# Patient Record
Sex: Male | Born: 1960 | Race: White | Hispanic: No | Marital: Married | Smoking: Never smoker
Health system: Southern US, Community
[De-identification: ages and names within clinical notes are randomized; demographics above are authoritative.]

## PROBLEM LIST (undated history)

## (undated) DIAGNOSIS — K219 Gastro-esophageal reflux disease without esophagitis: Secondary | ICD-10-CM

## (undated) DIAGNOSIS — Z86010 Personal history of colonic polyps: Secondary | ICD-10-CM

## (undated) HISTORY — DX: Gastro-esophageal reflux disease without esophagitis: K21.9

## (undated) HISTORY — PX: EYE SURGERY: SHX253

## (undated) HISTORY — DX: Personal history of colonic polyps: Z86.010

## (undated) HISTORY — PX: VASECTOMY: SHX75

---

## 2004-01-15 ENCOUNTER — Encounter: Admission: RE | Admit: 2004-01-15 | Discharge: 2004-01-15 | Payer: Self-pay | Admitting: Family Medicine

## 2009-03-14 ENCOUNTER — Ambulatory Visit: Payer: Self-pay | Admitting: Internal Medicine

## 2009-03-14 LAB — CONVERTED CEMR LAB
ALT: 19 units/L (ref 0–53)
Basophils Relative: 0.4 % (ref 0.0–3.0)
Bilirubin Urine: NEGATIVE
Bilirubin, Direct: 0.1 mg/dL (ref 0.0–0.3)
Calcium: 9.3 mg/dL (ref 8.4–10.5)
Cholesterol: 174 mg/dL (ref 0–200)
Eosinophils Absolute: 0.1 10*3/uL (ref 0.0–0.7)
Eosinophils Relative: 2.8 % (ref 0.0–5.0)
GFR calc non Af Amer: 84.06 mL/min (ref >60)
HCT: 40.1 % (ref 39.0–52.0)
HDL: 94 mg/dL (ref >39.00)
Hemoglobin, Urine: NEGATIVE
Leukocytes, UA: NEGATIVE
Lymphs Abs: 1.7 10*3/uL (ref 0.7–4.0)
MCHC: 34.3 g/dL (ref 30.0–36.0)
MCV: 93.3 fL (ref 78.0–100.0)
Monocytes Absolute: 0.5 10*3/uL (ref 0.1–1.0)
Neutrophils Relative %: 52 % (ref 43.0–77.0)
Nitrite: NEGATIVE
PSA: 0.66 ng/mL (ref 0.10–4.00)
Platelets: 188 10*3/uL (ref 150.0–400.0)
Potassium: 4.5 meq/L (ref 3.5–5.1)
Sodium: 143 meq/L (ref 135–145)
TSH: 1.21 microintl units/mL (ref 0.35–5.50)
Total Bilirubin: 0.9 mg/dL (ref 0.3–1.2)
Total Protein: 7 g/dL (ref 6.0–8.3)
VLDL: 7.6 mg/dL (ref 0.0–40.0)
WBC: 4.8 10*3/uL (ref 4.5–10.5)

## 2009-03-17 ENCOUNTER — Ambulatory Visit: Payer: Self-pay | Admitting: Internal Medicine

## 2009-03-17 DIAGNOSIS — K219 Gastro-esophageal reflux disease without esophagitis: Secondary | ICD-10-CM

## 2009-03-17 HISTORY — DX: Gastro-esophageal reflux disease without esophagitis: K21.9

## 2009-04-08 ENCOUNTER — Ambulatory Visit: Payer: Self-pay | Admitting: Gastroenterology

## 2009-09-22 ENCOUNTER — Ambulatory Visit: Payer: Self-pay | Admitting: Internal Medicine

## 2009-09-22 DIAGNOSIS — M25539 Pain in unspecified wrist: Secondary | ICD-10-CM | POA: Insufficient documentation

## 2009-09-25 ENCOUNTER — Encounter (INDEPENDENT_AMBULATORY_CARE_PROVIDER_SITE_OTHER): Payer: Self-pay | Admitting: *Deleted

## 2009-10-31 ENCOUNTER — Encounter: Payer: Self-pay | Admitting: Internal Medicine

## 2009-11-10 ENCOUNTER — Telehealth: Payer: Self-pay | Admitting: Internal Medicine

## 2009-11-17 ENCOUNTER — Encounter: Payer: Self-pay | Admitting: Internal Medicine

## 2010-11-05 NOTE — Progress Notes (Signed)
Summary: EKG  Phone Note Outgoing Call   Summary of Call: please inform pt , that we are in receipt of his EKG performed at the recent GI evaluation;  it is normal rhythm but has "first degree block";  this is a minor electrical conduction abnormality of the heart that is not normal, but is not serious, does not get worse , is not related to other more serious problems, and is permanent but does not require further eval or treatment Initial call taken by: Corwin Levins MD,  November 10, 2009 9:57 AM  Follow-up for Phone Call        Patient notified and had been notified previously when he was a patient at [facility]. Follow-up by: Lucious Groves,  November 10, 2009 1:43 PM

## 2010-11-05 NOTE — Consult Note (Signed)
Summary: [facility]  [facility]   Imported By: Lester Daniels 11/22/2009 08:48:55  _____________________________________________________________________  External Attachment:    Type:   Image     Comment:   External Document

## 2011-11-08 ENCOUNTER — Ambulatory Visit: Payer: Self-pay | Admitting: Internal Medicine

## 2011-11-08 ENCOUNTER — Ambulatory Visit (INDEPENDENT_AMBULATORY_CARE_PROVIDER_SITE_OTHER): Payer: BC Managed Care – PPO | Admitting: Internal Medicine

## 2011-11-08 ENCOUNTER — Encounter: Payer: Self-pay | Admitting: Internal Medicine

## 2011-11-08 VITALS — BP 120/80 | HR 56 | Temp 99.6°F | Ht 70.0 in | Wt 185.0 lb

## 2011-11-08 DIAGNOSIS — J209 Acute bronchitis, unspecified: Secondary | ICD-10-CM

## 2011-11-08 DIAGNOSIS — Z0000 Encounter for general adult medical examination without abnormal findings: Secondary | ICD-10-CM

## 2011-11-08 DIAGNOSIS — Z86010 Personal history of colonic polyps: Secondary | ICD-10-CM

## 2011-11-08 HISTORY — DX: Personal history of colonic polyps: Z86.010

## 2011-11-08 MED ORDER — HYDROCODONE-HOMATROPINE 5-1.5 MG/5ML PO SYRP: 5.0000 mL | ORAL_SOLUTION | Freq: Four times a day (QID) | ORAL | Status: AC | PRN

## 2011-11-08 MED ORDER — LEVOFLOXACIN 250 MG PO TABS: 250.0000 mg | ORAL_TABLET | Freq: Every day | ORAL | Status: AC
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
## 2011-11-08 NOTE — Progress Notes (Signed)
  Subjective:    Patient ID: Joseph Trujillo, male    DOB: 1961-01-24, 51 y.o.   MRN: [id number]  HPI    Review of Systems     Objective:   Physical Exam        Assessment & Plan:

## 2011-11-08 NOTE — Assessment & Plan Note (Signed)
Mild to mod, for antibx course,  to f/u any worsening symptoms or concerns 

## 2011-11-08 NOTE — Progress Notes (Signed)
  Subjective:    Patient ID: Joseph Trujillo, male    DOB: August 05, 1961, 51 y.o.   MRN: [id number]  HPI Here with acute onset mild to mod 2 wks ST, HA, general weakness and malaise, with prod cough greenish sputum, but Pt denies chest pain, increased sob or doe, wheezing, orthopnea, PND, increased LE swelling, palpitations, dizziness or syncope.   Pt denies polydipsia, polyuria. Past Medical History  Diagnosis Date  . Hx of colonic polyp 11/08/2011  . GERD 03/17/2009   Past Surgical History  Procedure Date  . Vasectomy     reports that he has never smoked. He does not have any smokeless tobacco history on file. He reports that he drinks alcohol. He reports that he does not use illicit drugs. family history includes Cancer in his mother and Heart disease in his father. No Known Allergies No current outpatient prescriptions on file prior to visit.   Review of Systems Review of Systems  Constitutional: Negative for diaphoresis and unexpected weight change.  HENT: Negative for drooling and tinnitus.   Eyes: Negative for photophobia and visual disturbance.  Respiratory: Negative for choking and stridor.   Gastrointestinal: Negative for vomiting and blood in stool.  Genitourinary: Negative for hematuria and decreased urine volume.      Objective:   Physical Exam BP 120/80  Pulse 56  Temp(Src) 99.6 F (37.6 C) (Oral)  Ht 5\' 10"  (1.778 m)  Wt 185 lb (83.915 kg)  BMI 26.54 kg/m2  SpO2 97% Physical Exam  VS noted, mild ill Constitutional: Pt appears well-developed and well-nourished.  HENT: Head: Normocephalic.  Right Ear: External ear normal.  Left Ear: External ear normal.  Bilat tm's mild erythema.  Sinus nontender.  Pharynx mild erythema Eyes: Conjunctivae and EOM are normal. Pupils are equal, round, and reactive to light.  Neck: Normal range of motion. Neck supple.  Cardiovascular: Normal rate and regular rhythm.   Pulmonary/Chest: Effort normal and breath sounds normal.    Neurological: Pt is alert. No cranial nerve deficit.  Skin: Skin is warm. No erythema.  Psychiatric: Pt behavior is normal. Thought content normal.     Assessment & Plan:

## 2011-11-08 NOTE — Patient Instructions (Addendum)
Take all new medications as prescribed. Continue all other medications as before Please return in 6 mo with Lab testing done 3-5 days before  

## 2012-05-12 ENCOUNTER — Ambulatory Visit: Payer: BC Managed Care – PPO | Admitting: Internal Medicine

## 2013-07-19 ENCOUNTER — Ambulatory Visit (INDEPENDENT_AMBULATORY_CARE_PROVIDER_SITE_OTHER): Payer: BC Managed Care – PPO | Admitting: Internal Medicine

## 2013-07-19 ENCOUNTER — Encounter: Payer: Self-pay | Admitting: Internal Medicine

## 2013-07-19 VITALS — BP 128/90 | HR 65 | Temp 98.1°F | Wt 189.0 lb

## 2013-07-19 DIAGNOSIS — T300 Burn of unspecified body region, unspecified degree: Secondary | ICD-10-CM | POA: Insufficient documentation

## 2013-07-19 MED ORDER — SILVER SULFADIAZINE 1 % EX CREA: TOPICAL_CREAM | Freq: Two times a day (BID) | CUTANEOUS | Status: DC
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
## 2013-07-31 ENCOUNTER — Encounter: Payer: Self-pay | Admitting: Internal Medicine

## 2013-08-02 ENCOUNTER — Ambulatory Visit (INDEPENDENT_AMBULATORY_CARE_PROVIDER_SITE_OTHER): Payer: BC Managed Care – PPO | Admitting: Internal Medicine

## 2013-08-02 ENCOUNTER — Encounter: Payer: Self-pay | Admitting: Internal Medicine

## 2013-08-02 VITALS — BP 140/96 | HR 58 | Temp 97.8°F | Wt 187.4 lb

## 2013-08-02 DIAGNOSIS — T300 Burn of unspecified body region, unspecified degree: Secondary | ICD-10-CM
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
## 2013-12-27 ENCOUNTER — Ambulatory Visit: Payer: BC Managed Care – PPO | Admitting: Physician Assistant

## 2013-12-28 ENCOUNTER — Ambulatory Visit: Payer: BC Managed Care – PPO | Admitting: Internal Medicine

## 2014-07-10 ENCOUNTER — Ambulatory Visit (INDEPENDENT_AMBULATORY_CARE_PROVIDER_SITE_OTHER)
Admission: RE | Admit: 2014-07-10 | Discharge: 2014-07-10 | Disposition: A | Discharge status: Home / Self Care | Payer: BC Managed Care – PPO | Source: Ambulatory Visit | Attending: Family Medicine | Admitting: Family Medicine

## 2014-07-10 ENCOUNTER — Other Ambulatory Visit (INDEPENDENT_AMBULATORY_CARE_PROVIDER_SITE_OTHER): Payer: BC Managed Care – PPO

## 2014-07-10 ENCOUNTER — Encounter: Payer: Self-pay | Admitting: Family Medicine

## 2014-07-10 ENCOUNTER — Ambulatory Visit (INDEPENDENT_AMBULATORY_CARE_PROVIDER_SITE_OTHER): Payer: BC Managed Care – PPO | Admitting: Family Medicine

## 2014-07-10 VITALS — BP 126/78 | HR 61 | Ht 71.0 in | Wt 183.0 lb

## 2014-07-10 DIAGNOSIS — M79605 Pain in left leg: Secondary | ICD-10-CM

## 2014-07-10 DIAGNOSIS — M6289 Other specified disorders of muscle: Secondary | ICD-10-CM | POA: Insufficient documentation

## 2014-07-10 DIAGNOSIS — M6580 Other synovitis and tenosynovitis, unspecified site: Secondary | ICD-10-CM

## 2014-07-10 MED ORDER — NITROGLYCERIN 0.2 MG/HR TD PT24: MEDICATED_PATCH | TRANSDERMAL | Status: DC

## 2014-07-10 MED ORDER — MELOXICAM 15 MG PO TABS: 15.0000 mg | ORAL_TABLET | Freq: Every day | ORAL | Status: DC

## 2014-07-10 NOTE — Progress Notes (Signed)
Tawana ScaleZach Jhordan Trujillo D.O. [facility] [address] [place], [place] [address] Phone: [phone] Subjective:     CC: Pain in hip left  BJY:NWGNFAOZHYHPI:Subjective Joseph Trujillo is a 53 y.o. male coming in with complaint of left hip pain. Patient is an avid runner and is training for a marathon. Patient tended to notice that he is having some mild discomfort with running on the lateral aspect of his hip. Patient states that it seemed to be more in the lateral and anterior aspect of his hip. Patient states that unfortunately after a 16 mile jog he unfortunately had to discontinue because of the discomfort 3 days ago and is here for further evaluation. Patient states that the pain is localized more of a dull aching sensation. Can have some discomfort when he is putting pressure going up stairs. Denies any significant joint pain. Denies any significant groin pain. No radiation of the leg or any numbness or weakness. Patient rates the severity is 6/10. Patient has a marathon to run in 3 weeks to months make sure that he can successfully do this.     Past medical history, social, surgical and family history all reviewed in electronic medical record.   Review of Systems: No headache, visual changes, nausea, vomiting, diarrhea, constipation, dizziness, abdominal pain, skin rash, fevers, chills, night sweats, weight loss, swollen lymph nodes, body aches, joint swelling, muscle aches, chest pain, shortness of breath, mood changes.   Objective Blood pressure 126/78, pulse 61, height 5\' 11"  (1.803 m), weight 183 lb (83.008 kg), SpO2 99.00%.  General: No apparent distress alert and oriented x3 mood and affect normal, dressed appropriately.  HEENT: Pupils equal, extraocular movements intact  Respiratory: Patient's speak in full sentences and does not appear short of breath  Cardiovascular: No lower extremity edema, non tender, no erythema  Skin: Warm dry intact with no signs of infection or rash on extremities or  on axial skeleton.  Abdomen: Soft nontender  Neuro: Cranial nerves II through XII are intact, neurovascularly intact in all extremities with 2+ DTRs and 2+ pulses.  Lymph: No lymphadenopathy of posterior or anterior cervical chain or axillae bilaterally.  Gait normal with good balance and coordination.  MSK:  Non tender with full range of motion and good stability and symmetric strength and tone of shoulders, elbows, wrist,  knee and ankles bilaterally.  Hip: Left ROM IR: 25 Deg, ER: 45 Deg, Flexion: 120 Deg, Extension: 100 Deg, Abduction: 45 Deg, Adduction: 45 Deg Strength IR: 5/5, ER: 5/5, Flexion: 5/5, Extension: 5/5, Abduction: 5/5, Adduction: 5/5 Pelvic alignment unremarkable to inspection and palpation. Standing hip rotation and gait without trendelenburg sign / unsteadiness. Greater trochanter with mild tenderness but does have tenderness over the tensor fascia lata as well as the gluteus medius insertion.. No tenderness over piriformis and greater trochanter. pain with FABER BEEN NEGATIVE FADIR. No SI joint tenderness and normal minimal SI movement. Negative fulcrum test Contralateral hip unremarkable  MSK US performed of: Left This study was ordered, performed, and interpreted by Joseph Trujillo D.O.  Hip: Trochanteric bursa with mild swelling. Patient does have what appears to be a tear in the tensor fascia lata with increasing Doppler flow. Acetabular labrum visualized and without tears, displacement, or effusion in joint. Femoral neck appears unremarkable without increased power doppler signal along Cortex. No signs of stress fracture  IMPRESSION:  Mild greater trochanteric bursitis with a tensor fascia lata tear.    Impression and Recommendations:     This case required  medical decision making of moderate complexity.
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
## 2014-07-10 NOTE — Patient Instructions (Addendum)
Good to see you Joseph Trujillo 20 minutes 2 times daily. Usually after activity and before bed. Exercises 3 times a week.   Alternate the TFL and the glute exercises Running only 3 days a week. Potentially running on track or treadmill instead.  Vitain D 2000 IU daily Meloxicam daily for 10 days then as needed Nitroglycerin Protocol   Apply 1/4 nitroglycerin patch to affected area daily.  Change position of patch within the affected area every 24 hours.  You may experience a headache during the first 1-2 weeks of using the patch, these should subside.  If you experience headaches after beginning nitroglycerin patch treatment, you may take your preferred over the counter pain reliever.  Another side effect of the nitroglycerin patch is skin irritation or rash related to patch adhesive.  Please notify our office if you develop more severe headaches or rash, and stop the patch.  Tendon healing with nitroglycerin patch may require 12 to 24 weeks depending on the extent of injury.  Men should not use if taking Viagra, Cialis, or Levitra.   Do not use if you have migraines or rosacea.   Come back in 2 weeks. If still in pain will try injection.

## 2014-07-19 ENCOUNTER — Ambulatory Visit: Payer: BC Managed Care – PPO | Admitting: Family Medicine

## 2014-07-24 ENCOUNTER — Ambulatory Visit: Payer: BC Managed Care – PPO | Admitting: Internal Medicine

## 2016-07-13 ENCOUNTER — Telehealth: Payer: Self-pay | Admitting: Internal Medicine

## 2016-07-13 NOTE — Telephone Encounter (Signed)
Ok with me, but remember I do not treat chronic pain with sched II narcotics or higher in my practice

## 2016-07-13 NOTE — Telephone Encounter (Signed)
Patient was last seen in 2013. He is now requesting a CPE with you. Do you still accept him as a patient? Please advise, Thank you.

## 2016-07-13 NOTE — Telephone Encounter (Signed)
I called patient and LVM to call back. No answer.

## 2017-02-24 ENCOUNTER — Encounter: Payer: Self-pay | Admitting: Sports Medicine

## 2017-02-24 ENCOUNTER — Ambulatory Visit (INDEPENDENT_AMBULATORY_CARE_PROVIDER_SITE_OTHER): Payer: BC Managed Care – PPO | Admitting: Sports Medicine

## 2017-02-24 VITALS — BP 120/82 | HR 75 | Ht 71.0 in | Wt 190.8 lb

## 2017-02-24 DIAGNOSIS — B070 Plantar wart: Secondary | ICD-10-CM

## 2017-02-24 DIAGNOSIS — M79672 Pain in left foot: Secondary | ICD-10-CM | POA: Diagnosis not present

## 2017-02-24 NOTE — Patient Instructions (Signed)
Try getting over-the-counter Compound W Band-Aids and applying daily. Also use a pumice stone to gently debride calluses on the bottom of your feet. I recommend that you obtain over-the-counter SOLE  medium cushioned insoles.  These can be found at [employer] - or on-line at [employer]  Search for "SOLE Active Medium Shoe Insoles"  If you have not had good improvement over the next several weeks we can discuss freezing the wart at that time.
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
## 2017-02-28 DIAGNOSIS — B070 Plantar wart: Secondary | ICD-10-CM | POA: Insufficient documentation

## 2017-02-28 NOTE — Assessment & Plan Note (Signed)
Treatment per AVS. We did discuss the possibility of cryoablation if any lack of improvement would like to consider conservative management initially.  Can consider custom cushion orthotics going forward as well given the ongoing pain he has had.

## 2017-03-21 ENCOUNTER — Encounter: Payer: Self-pay | Admitting: Physician Assistant

## 2017-03-21 ENCOUNTER — Ambulatory Visit (INDEPENDENT_AMBULATORY_CARE_PROVIDER_SITE_OTHER): Payer: BC Managed Care – PPO | Admitting: Physician Assistant

## 2017-03-21 VITALS — BP 108/68 | HR 61 | Temp 97.9°F | Resp 18 | Ht 69.96 in | Wt 188.8 lb

## 2017-03-21 DIAGNOSIS — W57XXXA Bitten or stung by nonvenomous insect and other nonvenomous arthropods, initial encounter: Secondary | ICD-10-CM

## 2017-03-21 DIAGNOSIS — L03113 Cellulitis of right upper limb: Secondary | ICD-10-CM | POA: Diagnosis not present

## 2017-03-21 MED ORDER — DOXYCYCLINE HYCLATE 100 MG PO CAPS: 100.0000 mg | ORAL_CAPSULE | Freq: Two times a day (BID) | ORAL | 0 refills | Status: DC
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
## 2017-03-21 NOTE — Patient Instructions (Addendum)
We are going to treat your cellulitis with doxycycline, which is an antibiotic that also treats  tick borne illnesses.Please use sunscreen while on this antibiotic as it increases your chances of getting sun burned. It is likely you may have some retained products where the tick bit you but your body will expel this over time. The redness should improve over the next 24-48 hours. Please return to clinic if symptoms worsen, do not improve, or as needed    Cellulitis, Adult Cellulitis is a skin infection. The infected area is usually red and sore. This condition occurs most often in the arms and lower legs. It is very important to get treated for this condition. Follow these instructions at home:  Take over-the-counter and prescription medicines only as told by your doctor.  If you were prescribed an antibiotic medicine, take it as told by your doctor. Do not stop taking the antibiotic even if you start to feel better.  Drink enough fluid to keep your pee (urine) clear or pale yellow.  Do not touch or rub the infected area.  Raise (elevate) the infected area above the level of your heart while you are sitting or lying down.  Place warm or cold wet cloths (warm or cold compresses) on the infected area. Do this as told by your doctor.  Keep all follow-up visits as told by your doctor. This is important. These visits let your doctor make sure your infection is not getting worse. Contact a doctor if:  You have a fever.  Your symptoms do not get better after 1-2 days of treatment.  Your bone or joint under the infected area starts to hurt after the skin has healed.  Your infection comes back. This can happen in the same area or another area.  You have a swollen bump in the infected area.  You have new symptoms.  You feel ill and also have muscle aches and pains. Get help right away if:  Your symptoms get worse.  You feel very sleepy.  You throw up (vomit) or have watery poop  (diarrhea) for a long time.  There are red streaks coming from the infected area.  Your red area gets larger.  Your red area turns darker. This information is not intended to replace advice given to you by your health care provider. Make sure you discuss any questions you have with your health care provider. Document Released: 03/08/2008 Document Revised: 02/26/2016 Document Reviewed: 07/30/2015 Elsevier Interactive Patient Education  2018 [employer].  Tick Bite Information Introduction Ticks are insects that attach themselves to the skin. There are many types of ticks. Common types include wood ticks and deer ticks. Sometimes, ticks carry diseases that can make a person very ill. The most common places for ticks to attach themselves are the scalp, neck, armpits, waist, and groin. HOW CAN YOU PREVENT TICK BITES? Take these steps to help prevent tick bites when you are outdoors:  Wear long sleeves and long pants.  Wear white clothes so you can see ticks more easily.  Tuck your pant legs into your socks.  If walking on a trail, stay in the middle of the trail to avoid brushing against bushes.  Avoid walking through areas with long grass.  Put bug spray on all skin that is showing and along boot tops, pant legs, and sleeve cuffs.  Check clothes, hair, and skin often and before going inside.  Brush off any ticks that are not attached.  Take a shower or bath  as soon as possible after being outdoors.  HOW SHOULD YOU REMOVE A TICK? Ticks should be removed as soon as possible to help prevent diseases. 1. If latex gloves are available, put them on before trying to remove a tick. 2. Use tweezers to grasp the tick as close to the skin as possible. You may also use curved forceps or a tick removal tool. Grasp the tick as close to its head as possible. Avoid grasping the tick on its body. 3. Pull gently upward until the tick lets go. Do not twist the tick or jerk it suddenly. This may  break off the tick's head or mouth parts. 4. Do not squeeze or crush the tick's body. This could force disease-carrying fluids from the tick into your body. 5. After the tick is removed, wash the bite area and your hands with soap and water or alcohol. 6. Apply a small amount of antiseptic cream or ointment to the bite site. 7. Wash any tools that were used.  Do not try to remove a tick by applying a hot match, petroleum jelly, or fingernail polish to the tick. These methods do not work. They may also increase the chances of disease being spread from the tick bite. WHEN SHOULD YOU SEEK HELP? Contact your health care provider if you are unable to remove a tick or if a part of the tick breaks off in the skin. After a tick bite, you need to watch for signs and symptoms of diseases that can be spread by ticks. Contact your health care provider if you develop any of the following:  Fever.  Rash.  Redness and puffiness (swelling) in the area of the tick bite.  Tender, puffy lymph glands.  Watery poop (diarrhea).  Weight loss.  Cough.  Feeling more tired than normal (fatigue).  Muscle, joint, or bone pain.  Belly (abdominal) pain.  Headache.  Change in your level of consciousness.  Trouble walking or moving your legs.  Loss of feeling (numbness) in the legs.  Loss of movement (paralysis).  Shortness of breath.  Confusion.  Throwing up (vomiting) many times.  This information is not intended to replace advice given to you by your health care provider. Make sure you discuss any questions you have with your health care provider. Document Released: 12/15/2009 Document Revised: 02/26/2016 Document Reviewed: 02/28/2013 Elsevier Interactive Patient Education  2018 [employer]   IF you received an x-ray today, you will receive an invoice from [facility]. Please contact [facility] at [phone] with questions or concerns regarding your invoice.   IF you  received labwork today, you will receive an invoice from [facility]. Please contact [facility] at [phone] with questions or concerns regarding your invoice.   Our billing staff will not be able to assist you with questions regarding bills from these companies.  You will be contacted with the lab results as soon as they are available. The fastest way to get your results is to activate your My Chart account. Instructions are located on the last page of this paperwork. If you have not heard from us regarding the results in 2 weeks, please contact this office.

## 2017-03-25 ENCOUNTER — Ambulatory Visit: Payer: BC Managed Care – PPO | Admitting: Sports Medicine

## 2017-04-13 ENCOUNTER — Other Ambulatory Visit (INDEPENDENT_AMBULATORY_CARE_PROVIDER_SITE_OTHER): Payer: BC Managed Care – PPO

## 2017-04-13 ENCOUNTER — Encounter: Payer: Self-pay | Admitting: Internal Medicine

## 2017-04-13 ENCOUNTER — Ambulatory Visit (INDEPENDENT_AMBULATORY_CARE_PROVIDER_SITE_OTHER): Payer: BC Managed Care – PPO | Admitting: Internal Medicine

## 2017-04-13 VITALS — BP 140/84 | HR 81 | Ht 69.0 in | Wt 190.0 lb

## 2017-04-13 DIAGNOSIS — Z114 Encounter for screening for human immunodeficiency virus [HIV]: Secondary | ICD-10-CM

## 2017-04-13 DIAGNOSIS — Z0001 Encounter for general adult medical examination with abnormal findings: Secondary | ICD-10-CM

## 2017-04-13 DIAGNOSIS — Z1159 Encounter for screening for other viral diseases: Secondary | ICD-10-CM | POA: Diagnosis not present

## 2017-04-13 DIAGNOSIS — Z0000 Encounter for general adult medical examination without abnormal findings: Secondary | ICD-10-CM | POA: Diagnosis not present

## 2017-04-13 LAB — HEPATIC FUNCTION PANEL
ALBUMIN: 4.5 g/dL (ref 3.5–5.2)
ALK PHOS: 53 U/L (ref 39–117)
ALT: 27 U/L (ref 0–53)
AST: 29 U/L (ref 0–37)
BILIRUBIN TOTAL: 0.7 mg/dL (ref 0.2–1.2)
Bilirubin, Direct: 0.2 mg/dL (ref 0.0–0.3)
Total Protein: 7 g/dL (ref 6.0–8.3)

## 2017-04-13 LAB — CBC WITH DIFFERENTIAL/PLATELET
BASOS PCT: 0.4 % (ref 0.0–3.0)
Basophils Absolute: 0 10*3/uL (ref 0.0–0.1)
EOS PCT: 4.7 % (ref 0.0–5.0)
Eosinophils Absolute: 0.2 10*3/uL (ref 0.0–0.7)
HCT: 40.3 % (ref 39.0–52.0)
HEMOGLOBIN: 13.7 g/dL (ref 13.0–17.0)
LYMPHS ABS: 1.9 10*3/uL (ref 0.7–4.0)
Lymphocytes Relative: 41.2 % (ref 12.0–46.0)
MCHC: 34 g/dL (ref 30.0–36.0)
MCV: 93.4 fl (ref 78.0–100.0)
MONOS PCT: 9.1 % (ref 3.0–12.0)
Monocytes Absolute: 0.4 10*3/uL (ref 0.1–1.0)
Neutro Abs: 2.1 10*3/uL (ref 1.4–7.7)
Neutrophils Relative %: 44.6 % (ref 43.0–77.0)
Platelets: 190 10*3/uL (ref 150.0–400.0)
RBC: 4.32 Mil/uL (ref 4.22–5.81)
RDW: 13.3 % (ref 11.5–15.5)
WBC: 4.6 10*3/uL (ref 4.0–10.5)

## 2017-04-13 LAB — LIPID PANEL
CHOL/HDL RATIO: 2
CHOLESTEROL: 187 mg/dL (ref 0–200)
HDL: 86.5 mg/dL (ref >39.00)
LDL CALC: 92 mg/dL (ref 0–99)
NonHDL: 100.67
Triglycerides: 43 mg/dL (ref 0.0–149.0)
VLDL: 8.6 mg/dL (ref 0.0–40.0)

## 2017-04-13 LAB — PSA: PSA: 1.06 ng/mL (ref 0.10–4.00)

## 2017-04-13 LAB — TSH: TSH: 1.64 u[IU]/mL (ref 0.35–4.50)

## 2017-04-13 LAB — BASIC METABOLIC PANEL
BUN: 14 mg/dL (ref 6–23)
CALCIUM: 9.7 mg/dL (ref 8.4–10.5)
CO2: 30 mEq/L (ref 19–32)
Chloride: 102 mEq/L (ref 96–112)
Creatinine, Ser: 0.99 mg/dL (ref 0.40–1.50)
GFR: 82.49 mL/min (ref >60.00)
GLUCOSE: 102 mg/dL — AB (ref 70–99)
Potassium: 4.9 mEq/L (ref 3.5–5.1)
SODIUM: 138 meq/L (ref 135–145)

## 2017-04-13 LAB — URINALYSIS, ROUTINE W REFLEX MICROSCOPIC
Bilirubin Urine: NEGATIVE
HGB URINE DIPSTICK: NEGATIVE
Ketones, ur: NEGATIVE
LEUKOCYTES UA: NEGATIVE
NITRITE: NEGATIVE
RBC / HPF: NONE SEEN (ref 0–?)
Specific Gravity, Urine: <=1.005 — AB (ref 1.000–1.030)
Total Protein, Urine: NEGATIVE
Urine Glucose: NEGATIVE
Urobilinogen, UA: 0.2 (ref 0.0–1.0)
WBC, UA: NONE SEEN (ref 0–?)
pH: 6.5 (ref 5.0–8.0)

## 2017-04-13 LAB — HIV ANTIBODY (ROUTINE TESTING W REFLEX): HIV 1&2 Ab, 4th Generation: NONREACTIVE

## 2017-04-13 LAB — HEPATITIS C ANTIBODY: HCV Ab: NEGATIVE

## 2017-04-13 NOTE — Patient Instructions (Signed)

## 2017-04-13 NOTE — Progress Notes (Signed)
Subjective:    Patient ID: Joseph Trujillo, male    DOB: June 13, 1961, 56 y.o.   MRN: [id number]  HPI   Here for wellness and f/u;  Overall doing ok;  Pt denies Chest pain, worsening SOB, DOE, wheezing, orthopnea, PND, worsening LE edema, palpitations, dizziness or syncope.  Pt denies neurological change such as new headache, facial or extremity weakness.  Pt denies polydipsia, polyuria, or low sugar symptoms. Pt states overall good compliance with treatment and medications, good tolerability, and has been trying to follow appropriate diet.  Pt denies worsening depressive symptoms, suicidal ideation or panic. No fever, night sweats, wt loss, loss of appetite, or other constitutional symptoms.  Pt states good ability with ADL's, has low fall risk, home safety reviewed and adequate, no other significant changes in hearing orvision, and occasionally active with exercise - Runs 3 times per wk, then plan for [facility] marathon in a few months.   Past Medical History:  Diagnosis Date  . GERD 03/17/2009  . Hx of colonic polyp 11/08/2011   Past Surgical History:  Procedure Laterality Date  . EYE SURGERY    . VASECTOMY      reports that he has never smoked. He has never used smokeless tobacco. He reports that he drinks alcohol. He reports that he does not use drugs. family history includes Cancer in his mother; Heart disease in his father. No Known Allergies No current outpatient prescriptions on file prior to visit.   No current facility-administered medications on file prior to visit.     Review of Systems Constitutional: Negative for other unusual diaphoresis, sweats, appetite or weight changes HENT: Negative for other worsening hearing loss, ear pain, facial swelling, mouth sores or neck stiffness.   Eyes: Negative for other worsening pain, redness or other visual disturbance.  Respiratory: Negative for other stridor or swelling Cardiovascular: Negative for other palpitations or other chest pain   Gastrointestinal: Negative for worsening diarrhea or loose stools, blood in stool, distention or other pain Genitourinary: Negative for hematuria, flank pain or other change in urine volume.  Musculoskeletal: Negative for myalgias or other joint swelling.  Skin: Negative for other color change, or other wound or worsening drainage.  Neurological: Negative for other syncope or numbness. Hematological: Negative for other adenopathy or swelling Psychiatric/Behavioral: Negative for hallucinations, other worsening agitation, SI, self-injury, or new decreased concentration All other system neg per pt    Objective:   Physical Exam BP 140/84   Pulse 81   Ht 5\' 9"  (1.753 m)   Wt 190 lb (86.2 kg)   SpO2 100%   BMI 28.06 kg/m  VS noted,  Constitutional: Pt is oriented to person, place, and time. Appears well-developed and well-nourished, in no significant distress and comfortable Head: Normocephalic and atraumatic  Eyes: Conjunctivae and EOM are normal. Pupils are equal, round, and reactive to light Right Ear: External ear normal without discharge Left Ear: External ear normal without discharge Nose: Nose without discharge or deformity Mouth/Throat: Oropharynx is without other ulcerations and moist  Neck: Normal range of motion. Neck supple. No JVD present. No tracheal deviation present or significant neck LA or mass Cardiovascular: Normal rate, regular rhythm, normal heart sounds and intact distal pulses.   Pulmonary/Chest: WOB normal and breath sounds without rales or wheezing  Abdominal: Soft. Bowel sounds are normal. NT. No HSM  Musculoskeletal: Normal range of motion. Exhibits no edema Lymphadenopathy: Has no other cervical adenopathy.  Neurological: Pt is alert and oriented to person, place, and  time. Pt has normal reflexes. No cranial nerve deficit. Motor grossly intact, Gait intact Skin: Skin is warm and dry. No rash noted or new ulcerations Psychiatric:  Has normal mood and affect.  Behavior is normal without agitation No other exam findings Lab Results  Component Value Date   WBC 4.6 04/13/2017   HGB 13.7 04/13/2017   HCT 40.3 04/13/2017   PLT 190.0 04/13/2017   GLUCOSE 102 (H) 04/13/2017   CHOL 187 04/13/2017   TRIG 43.0 04/13/2017   HDL 86.50 04/13/2017   LDLCALC 92 04/13/2017   ALT 27 04/13/2017   AST 29 04/13/2017   NA 138 04/13/2017   K 4.9 04/13/2017   CL 102 04/13/2017   CREATININE 0.99 04/13/2017   BUN 14 04/13/2017   CO2 30 04/13/2017   TSH 1.64 04/13/2017   PSA 1.06 04/13/2017       Assessment & Plan:

## 2017-04-14 ENCOUNTER — Encounter: Payer: Self-pay | Admitting: Internal Medicine

## 2017-04-16 NOTE — Assessment & Plan Note (Signed)

## 2018-02-02 ENCOUNTER — Encounter: Payer: Self-pay | Admitting: Internal Medicine

## 2018-02-02 NOTE — Telephone Encounter (Signed)
shirron or staff to assist pt to have Tdap done at a Nurse Visit please

## 2018-02-02 NOTE — Telephone Encounter (Signed)
Pt wants too make sure he does not need Hep A or MMR for isreal. Please advise

## 2018-02-02 NOTE — Telephone Encounter (Signed)
Appt changed to nurse.

## 2018-02-06 ENCOUNTER — Ambulatory Visit (INDEPENDENT_AMBULATORY_CARE_PROVIDER_SITE_OTHER): Payer: BC Managed Care – PPO

## 2018-02-06 ENCOUNTER — Ambulatory Visit: Payer: BC Managed Care – PPO | Admitting: Internal Medicine

## 2018-02-06 DIAGNOSIS — Z23 Encounter for immunization: Secondary | ICD-10-CM

## 2018-02-06 DIAGNOSIS — Z299 Encounter for prophylactic measures, unspecified: Secondary | ICD-10-CM

## 2018-02-09 ENCOUNTER — Ambulatory Visit (INDEPENDENT_AMBULATORY_CARE_PROVIDER_SITE_OTHER): Payer: BC Managed Care – PPO

## 2018-02-09 ENCOUNTER — Telehealth: Payer: Self-pay

## 2018-02-09 DIAGNOSIS — Z23 Encounter for immunization: Secondary | ICD-10-CM

## 2018-02-09 DIAGNOSIS — Z299 Encounter for prophylactic measures, unspecified: Secondary | ICD-10-CM

## 2018-02-09 NOTE — Telephone Encounter (Signed)
Per dr Jenny Reichmann, ok for patient to come in today to get MMR (recd Tdap 3 days ago)

## 2018-02-22 ENCOUNTER — Encounter: Payer: Self-pay | Admitting: Internal Medicine

## 2018-02-22 NOTE — Telephone Encounter (Signed)
Raynelle Fanning to assist with pt concern, thanks

## 2018-05-02 ENCOUNTER — Telehealth: Payer: Self-pay

## 2018-05-02 ENCOUNTER — Telehealth: Payer: Self-pay | Admitting: Internal Medicine

## 2018-05-02 NOTE — Telephone Encounter (Unsigned)
Copied from CRM [id number]. Topic: Quick Communication - Office Called Patient >> May 02, 2018  1:39 PM Alonna MiniumWilliams, Tamara P, RN wrote: Reason for CRM: needs to schedule nurse visit to get first shingrix vaccine--let tamara,RN at [facility] know when appt is made so that both vaccines can be labeled

## 2018-05-02 NOTE — Telephone Encounter (Signed)
Both vaccines have been labeled and placed in refrig 

## 2018-05-02 NOTE — Telephone Encounter (Signed)
Appointment has been made for 8/1

## 2018-05-02 NOTE — Telephone Encounter (Signed)
Left message asking patient to call back to schedule nurse visit to get first shingrix, let Joseph Jasmin,RN at [facility] know when appt is made so that both vaccines can be labeled

## 2018-05-04 ENCOUNTER — Ambulatory Visit (INDEPENDENT_AMBULATORY_CARE_PROVIDER_SITE_OTHER): Payer: BC Managed Care – PPO

## 2018-05-04 DIAGNOSIS — Z299 Encounter for prophylactic measures, unspecified: Secondary | ICD-10-CM

## 2018-05-31 ENCOUNTER — Other Ambulatory Visit (INDEPENDENT_AMBULATORY_CARE_PROVIDER_SITE_OTHER): Payer: BC Managed Care – PPO

## 2018-05-31 ENCOUNTER — Ambulatory Visit (INDEPENDENT_AMBULATORY_CARE_PROVIDER_SITE_OTHER)
Admission: RE | Admit: 2018-05-31 | Discharge: 2018-05-31 | Disposition: A | Discharge status: Home / Self Care | Payer: BC Managed Care – PPO | Source: Ambulatory Visit | Attending: Internal Medicine | Admitting: Internal Medicine

## 2018-05-31 ENCOUNTER — Ambulatory Visit (INDEPENDENT_AMBULATORY_CARE_PROVIDER_SITE_OTHER): Payer: BC Managed Care – PPO | Admitting: Internal Medicine

## 2018-05-31 ENCOUNTER — Encounter: Payer: Self-pay | Admitting: Internal Medicine

## 2018-05-31 VITALS — BP 130/82 | HR 62 | Temp 97.7°F | Ht 69.0 in | Wt 189.0 lb

## 2018-05-31 DIAGNOSIS — G8929 Other chronic pain: Secondary | ICD-10-CM | POA: Diagnosis not present

## 2018-05-31 DIAGNOSIS — M545 Low back pain, unspecified: Secondary | ICD-10-CM | POA: Insufficient documentation

## 2018-05-31 DIAGNOSIS — Z0000 Encounter for general adult medical examination without abnormal findings: Secondary | ICD-10-CM | POA: Diagnosis not present

## 2018-05-31 LAB — HEPATIC FUNCTION PANEL
ALBUMIN: 4.4 g/dL (ref 3.5–5.2)
ALK PHOS: 50 U/L (ref 39–117)
ALT: 19 U/L (ref 0–53)
AST: 24 U/L (ref 0–37)
Bilirubin, Direct: 0.2 mg/dL (ref 0.0–0.3)
Total Bilirubin: 0.9 mg/dL (ref 0.2–1.2)
Total Protein: 6.7 g/dL (ref 6.0–8.3)

## 2018-05-31 LAB — CBC WITH DIFFERENTIAL/PLATELET
BASOS PCT: 0.5 % (ref 0.0–3.0)
Basophils Absolute: 0 10*3/uL (ref 0.0–0.1)
EOS PCT: 3.1 % (ref 0.0–5.0)
Eosinophils Absolute: 0.2 10*3/uL (ref 0.0–0.7)
HCT: 38.5 % — ABNORMAL LOW (ref 39.0–52.0)
Hemoglobin: 13 g/dL (ref 13.0–17.0)
Lymphocytes Relative: 40.8 % (ref 12.0–46.0)
Lymphs Abs: 2.1 10*3/uL (ref 0.7–4.0)
MCHC: 33.7 g/dL (ref 30.0–36.0)
MCV: 92.8 fl (ref 78.0–100.0)
MONOS PCT: 7.8 % (ref 3.0–12.0)
Monocytes Absolute: 0.4 10*3/uL (ref 0.1–1.0)
Neutro Abs: 2.5 10*3/uL (ref 1.4–7.7)
Neutrophils Relative %: 47.8 % (ref 43.0–77.0)
PLATELETS: 175 10*3/uL (ref 150.0–400.0)
RBC: 4.15 Mil/uL — ABNORMAL LOW (ref 4.22–5.81)
RDW: 13.5 % (ref 11.5–15.5)
WBC: 5.2 10*3/uL (ref 4.0–10.5)

## 2018-05-31 LAB — URINALYSIS, ROUTINE W REFLEX MICROSCOPIC
Bilirubin Urine: NEGATIVE
Hgb urine dipstick: NEGATIVE
Ketones, ur: NEGATIVE
Leukocytes, UA: NEGATIVE
Nitrite: NEGATIVE
PH: 6.5 (ref 5.0–8.0)
RBC / HPF: NONE SEEN (ref 0–?)
Specific Gravity, Urine: <=1.005 — AB (ref 1.000–1.030)
Total Protein, Urine: NEGATIVE
UROBILINOGEN UA: 0.2 (ref 0.0–1.0)
Urine Glucose: NEGATIVE
WBC UA: NONE SEEN (ref 0–?)

## 2018-05-31 LAB — LIPID PANEL
Cholesterol: 164 mg/dL (ref 0–200)
HDL: 79.4 mg/dL (ref >39.00)
LDL Cholesterol: 76 mg/dL (ref 0–99)
NONHDL: 84.26
Total CHOL/HDL Ratio: 2
Triglycerides: 43 mg/dL (ref 0.0–149.0)
VLDL: 8.6 mg/dL (ref 0.0–40.0)

## 2018-05-31 LAB — TSH: TSH: 2.08 u[IU]/mL (ref 0.35–4.50)

## 2018-05-31 LAB — PSA: PSA: 1.31 ng/mL (ref 0.10–4.00)

## 2018-05-31 LAB — BASIC METABOLIC PANEL
BUN: 13 mg/dL (ref 6–23)
CHLORIDE: 99 meq/L (ref 96–112)
CO2: 30 meq/L (ref 19–32)
Calcium: 9.4 mg/dL (ref 8.4–10.5)
Creatinine, Ser: 0.92 mg/dL (ref 0.40–1.50)
GFR: 89.42 mL/min (ref >60.00)
Glucose, Bld: 91 mg/dL (ref 70–99)
Potassium: 3.7 mEq/L (ref 3.5–5.1)
SODIUM: 136 meq/L (ref 135–145)

## 2018-05-31 NOTE — Patient Instructions (Signed)
Please continue all other medications as before, and refills have been done if requested.  Please have the pharmacy call with any other refills you may need.  Please continue your efforts at being more active, low cholesterol diet, and weight control.  You are otherwise up to date with prevention measures today.  Please keep your appointments with your specialists as you may have planned  You will be contacted regarding the referral for: MRI lower back  Please go to the XRAY Department in the Basement (go straight as you get off the elevator) for the x-ray testing  Please go to the LAB in the Basement (turn left off the elevator) for the tests to be done today  You will be contacted by phone if any changes need to be made immediately.  Otherwise, you will receive a letter about your results with an explanation, but please check with MyChart first.  Please return in 1 year for your yearly visit, or sooner if needed, with Lab testing done 3-5 days before

## 2018-05-31 NOTE — Progress Notes (Signed)
Subjective:    Patient ID: Joseph Trujillo, male    DOB: 1961/06/07, 57 y.o.   MRN: [id number]  HPI  Here for wellness and f/u;  Overall doing ok;  Pt denies Chest pain, worsening SOB, DOE, wheezing, orthopnea, PND, worsening LE edema, palpitations, dizziness or syncope.  Pt denies neurological change such as new headache, facial or extremity weakness.  Pt denies polydipsia, polyuria, or low sugar symptoms. Pt states overall good compliance with treatment and medications, good tolerability, and has been trying to follow appropriate diet.  Pt denies worsening depressive symptoms, suicidal ideation or panic. No fever, night sweats, wt loss, loss of appetite, or other constitutional symptoms.  Pt states good ability with ADL's, has low fall risk, home safety reviewed and adequate, no other significant changes in hearing or vision, and only occasionally active with exercise. Also Wife asked him to mention he has somewhat looser stools recently with more greek yogurt in the am.  Also Has > 6 wks mid low back pain with training, ran marathon in [place] recently. Now more activity limiting, mod to occasional severe, but no bowel or bladder change, fever, wt loss,  worsening LE pain/numbness/weakness, gait change or falls. Past Medical History:  Diagnosis Date  . GERD 03/17/2009  . Hx of colonic polyp 11/08/2011   Past Surgical History:  Procedure Laterality Date  . EYE SURGERY    . VASECTOMY      reports that he has never smoked. He has never used smokeless tobacco. He reports that he drinks alcohol. He reports that he does not use drugs. family history includes Cancer in his mother; Heart disease in his father. No Known Allergies No current outpatient medications on file prior to visit.   No current facility-administered medications on file prior to visit.    Review of Systems Constitutional: Negative for other unusual diaphoresis, sweats, appetite or weight changes HENT: Negative for other  worsening hearing loss, ear pain, facial swelling, mouth sores or neck stiffness.   Eyes: Negative for other worsening pain, redness or other visual disturbance.  Respiratory: Negative for other stridor or swelling Cardiovascular: Negative for other palpitations or other chest pain  Gastrointestinal: Negative for worsening diarrhea or loose stools, blood in stool, distention or other pain Genitourinary: Negative for hematuria, flank pain or other change in urine volume.  Musculoskeletal: Negative for myalgias or other joint swelling.  Skin: Negative for other color change, or other wound or worsening drainage.  Neurological: Negative for other syncope or numbness. Hematological: Negative for other adenopathy or swelling Psychiatric/Behavioral: Negative for hallucinations, other worsening agitation, SI, self-injury, or new decreased concentration All other system neg per pt    Objective:   Physical Exam BP 130/82   Pulse 62   Temp 97.7 F (36.5 C) (Oral)   Ht 5\' 9"  (1.753 m)   Wt 189 lb (85.7 kg)   SpO2 97%   BMI 27.91 kg/m  VS noted,  Constitutional: Pt is oriented to person, place, and time. Appears well-developed and well-nourished, in no significant distress and comfortable Head: Normocephalic and atraumatic  Eyes: Conjunctivae and EOM are normal. Pupils are equal, round, and reactive to light Right Ear: External ear normal without discharge Left Ear: External ear normal without discharge Nose: Nose without discharge or deformity Mouth/Throat: Oropharynx is without other ulcerations and moist  Neck: Normal range of motion. Neck supple. No JVD present. No tracheal deviation present or significant neck LA or mass Cardiovascular: Normal rate, regular rhythm, normal heart  sounds and intact distal pulses.   Pulmonary/Chest: WOB normal and breath sounds without rales or wheezing  Abdominal: Soft. Bowel sounds are normal. NT. No HSM  Spine nontender in midline or  paravertebral Musculoskeletal: Normal range of motion. Exhibits no edema Lymphadenopathy: Has no other cervical adenopathy.  Neurological: Pt is alert and oriented to person, place, and time. Pt has normal reflexes. No cranial nerve deficit. Motor grossly intact, Gait intact Skin: Skin is warm and dry. No rash noted or new ulcerations Psychiatric:  Has normal mood and affect. Behavior is normal without agitation\ No other exam findings Lab Results  Component Value Date   WBC 5.2 05/31/2018   HGB 13.0 05/31/2018   HCT 38.5 (L) 05/31/2018   PLT 175.0 05/31/2018   GLUCOSE 91 05/31/2018   CHOL 164 05/31/2018   TRIG 43.0 05/31/2018   HDL 79.40 05/31/2018   LDLCALC 76 05/31/2018   ALT 19 05/31/2018   AST 24 05/31/2018   NA 136 05/31/2018   K 3.7 05/31/2018   CL 99 05/31/2018   CREATININE 0.92 05/31/2018   BUN 13 05/31/2018   CO2 30 05/31/2018   TSH 2.08 05/31/2018   PSA 1.31 05/31/2018       Assessment & Plan:

## 2018-05-31 NOTE — Assessment & Plan Note (Signed)
>   6 wks, not responding to conservative tx, no neuro change but concern for other etiology is high, for MRI LS spine

## 2018-05-31 NOTE — Assessment & Plan Note (Signed)

## 2018-06-13 ENCOUNTER — Encounter: Payer: Self-pay | Admitting: Internal Medicine

## 2018-06-13 NOTE — Telephone Encounter (Signed)
PCC's to see pt concern, thanks 

## 2018-06-19 ENCOUNTER — Other Ambulatory Visit: Payer: BC Managed Care – PPO

## 2018-06-19 NOTE — Telephone Encounter (Signed)
PCC's to see above 

## 2018-06-28 ENCOUNTER — Other Ambulatory Visit: Payer: BC Managed Care – PPO

## 2018-06-29 ENCOUNTER — Encounter: Payer: Self-pay | Admitting: Internal Medicine

## 2018-07-05 ENCOUNTER — Encounter: Payer: Self-pay | Admitting: Internal Medicine

## 2018-07-05 NOTE — Telephone Encounter (Signed)
shirron -   Ok to contact pt; I suspect he had a MRI at a non [facility] imaging place; please determine which one and ask for recent MRI results from 7.26

## 2018-07-06 ENCOUNTER — Telehealth: Payer: Self-pay

## 2018-07-06 ENCOUNTER — Encounter: Payer: Self-pay | Admitting: Internal Medicine

## 2018-07-06 NOTE — Telephone Encounter (Signed)
Informed pt that fax was not received yet. He verified fax number and will call [name] again to provide.   Copied from CRM [id number]. Topic: General - Other >> Jul 06, 2018 11:34 AM Joseph Trujillo wrote:  Pt is just following up on if his imaging from [facility] was received. He is asking for a call back to let him know if it was or was not received

## 2018-07-07 ENCOUNTER — Ambulatory Visit (INDEPENDENT_AMBULATORY_CARE_PROVIDER_SITE_OTHER): Payer: BC Managed Care – PPO

## 2018-07-07 DIAGNOSIS — Z23 Encounter for immunization: Secondary | ICD-10-CM | POA: Diagnosis not present

## 2018-07-08 ENCOUNTER — Encounter: Payer: Self-pay | Admitting: Internal Medicine

## 2018-07-11 ENCOUNTER — Telehealth: Payer: Self-pay

## 2018-07-11 NOTE — Telephone Encounter (Signed)
Pt returned call. Please rtn pt's call.

## 2018-07-11 NOTE — Telephone Encounter (Signed)
Called pt, LVM.   

## 2018-07-11 NOTE — Telephone Encounter (Signed)
-----   Message from Corwin Levins, MD sent at 07/10/2018  1:03 PM EDT ----- OK for Alani Sabbagh to contact pt  -   The LS spine MRI from 06/29/18 does show significant arthritis and degenerative disc disease which by themselves can cause chronic pain.  Also there is a couple of areas of possible nerve pinching.  If he still has pain, can we refer to orthopedic? thanks ----- Message ----- From: Roney Mans, CMA Sent: 07/07/2018   8:46 AM EDT To: Corwin Levins, MD  Dr. Jonny Ruiz please look at Dr. Pat Kocher notations after reviewing the report. Please advise.     ----- Message ----- From: Pincus Sanes, MD Sent: 07/07/2018   8:35 AM EDT To: Roney Mans, CMA  Reviewed MRI - no immediate concerns that need to be dealt with today - can wait until next week when Dr Jonny Ruiz returns.     ----- Message ----- From: Roney Mans, CMA Sent: 07/07/2018   8:31 AM EDT To: Pincus Sanes, MD  Dr. Lawerance Bach please advise.    ----- Message ----- From: Corwin Levins, MD Sent: 07/06/2018   4:23 PM EDT To: Roney Mans, CMA  Hello, can you run the results by Dr Lawerance Bach or Apolonio Schneiders to make sure no serious problem exists that has to be managed urgently?  thanks ----- Message ----- From: Roney Mans, CMA Sent: 07/06/2018   3:26 PM EDT To: Corwin Levins, MD  I have received the MRI results via fax from [place]. It is on your desk.

## 2018-07-11 NOTE — Telephone Encounter (Signed)
Returned pt's call, LVM. 

## 2018-07-11 NOTE — Telephone Encounter (Signed)
Pt has been informed. He stated that he is not in a great deal of pain. Its only here and there. He would like to hold off on the referral right now. He stated that he has an upcoming marathon that he is running in January and would like to wait until he starts training to see if that brings him any pain.

## 2018-07-11 NOTE — Telephone Encounter (Signed)
Pt returned call

## 2019-11-09 ENCOUNTER — Ambulatory Visit: Payer: BC Managed Care – PPO | Attending: Internal Medicine

## 2019-11-09 ENCOUNTER — Other Ambulatory Visit: Payer: BC Managed Care – PPO

## 2019-11-09 DIAGNOSIS — Z20822 Contact with and (suspected) exposure to covid-19: Secondary | ICD-10-CM

## 2019-11-10 LAB — NOVEL CORONAVIRUS, NAA: SARS-CoV-2, NAA: NOT DETECTED

## 2020-05-27 IMAGING — DX DG LUMBAR SPINE COMPLETE 4+V
5 series · 5 of 5 positions shown · non-contrast
Comparison: None.

CLINICAL DATA: Low back pain for the past 2 months. No known
injury.

EXAM:
LUMBAR SPINE - COMPLETE 4+ VIEW

[l-spine ap]
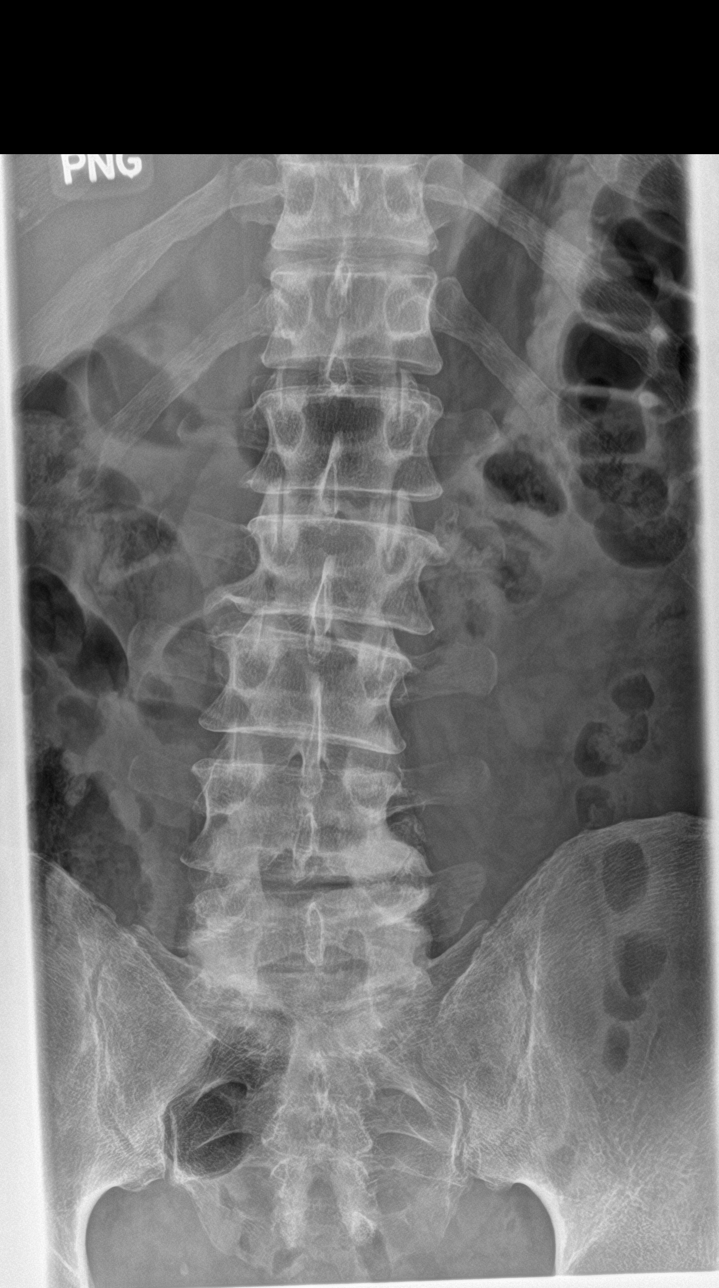

[l-spine obl (1 of 2)]
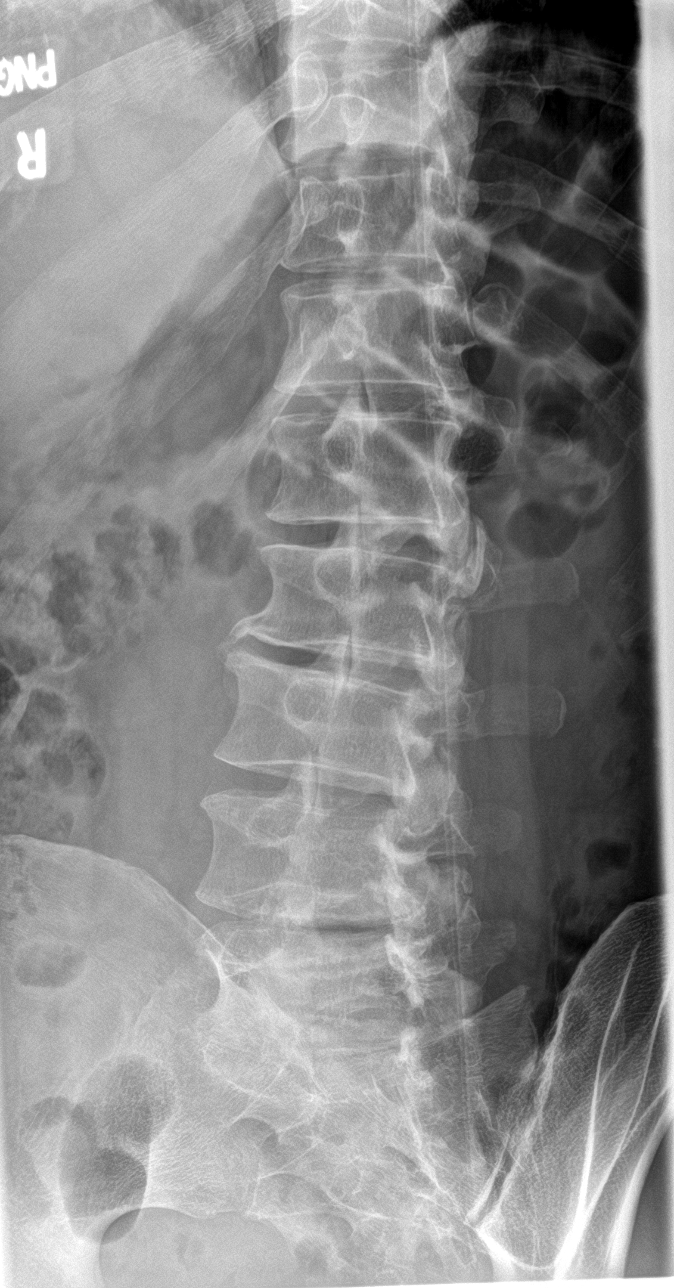

[l-spine obl (2 of 2)]
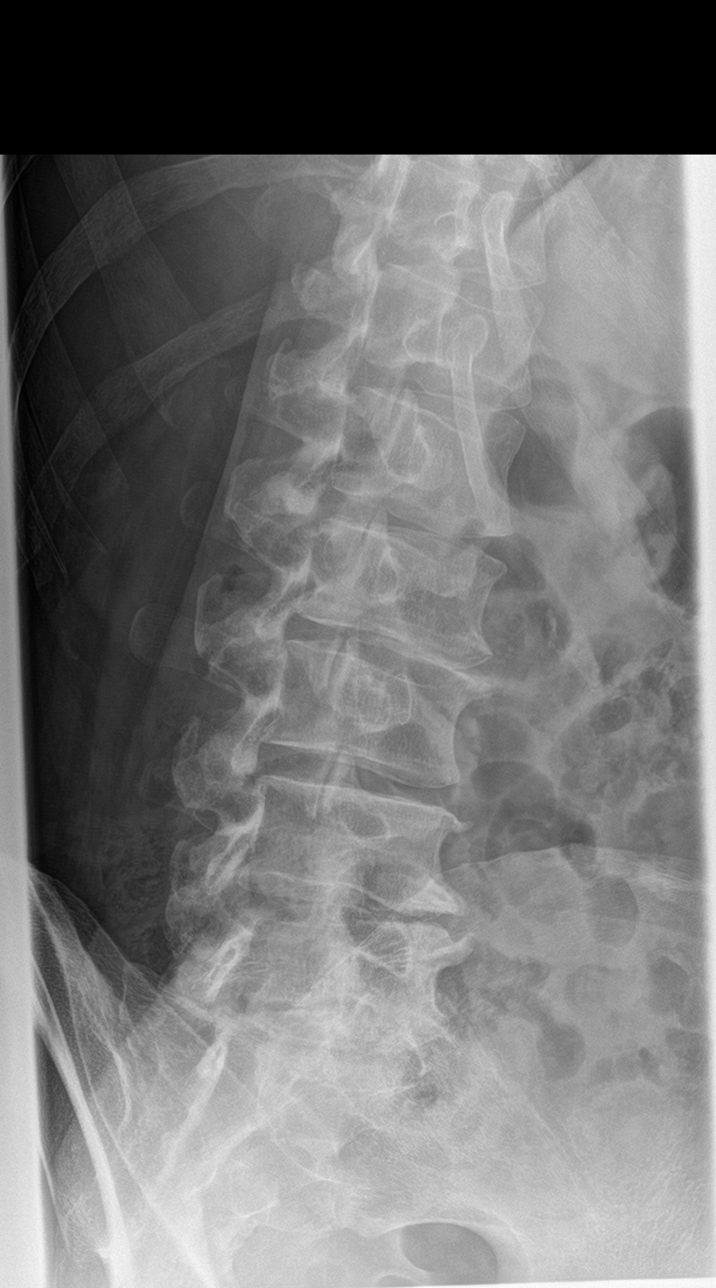

[l-spine lat]
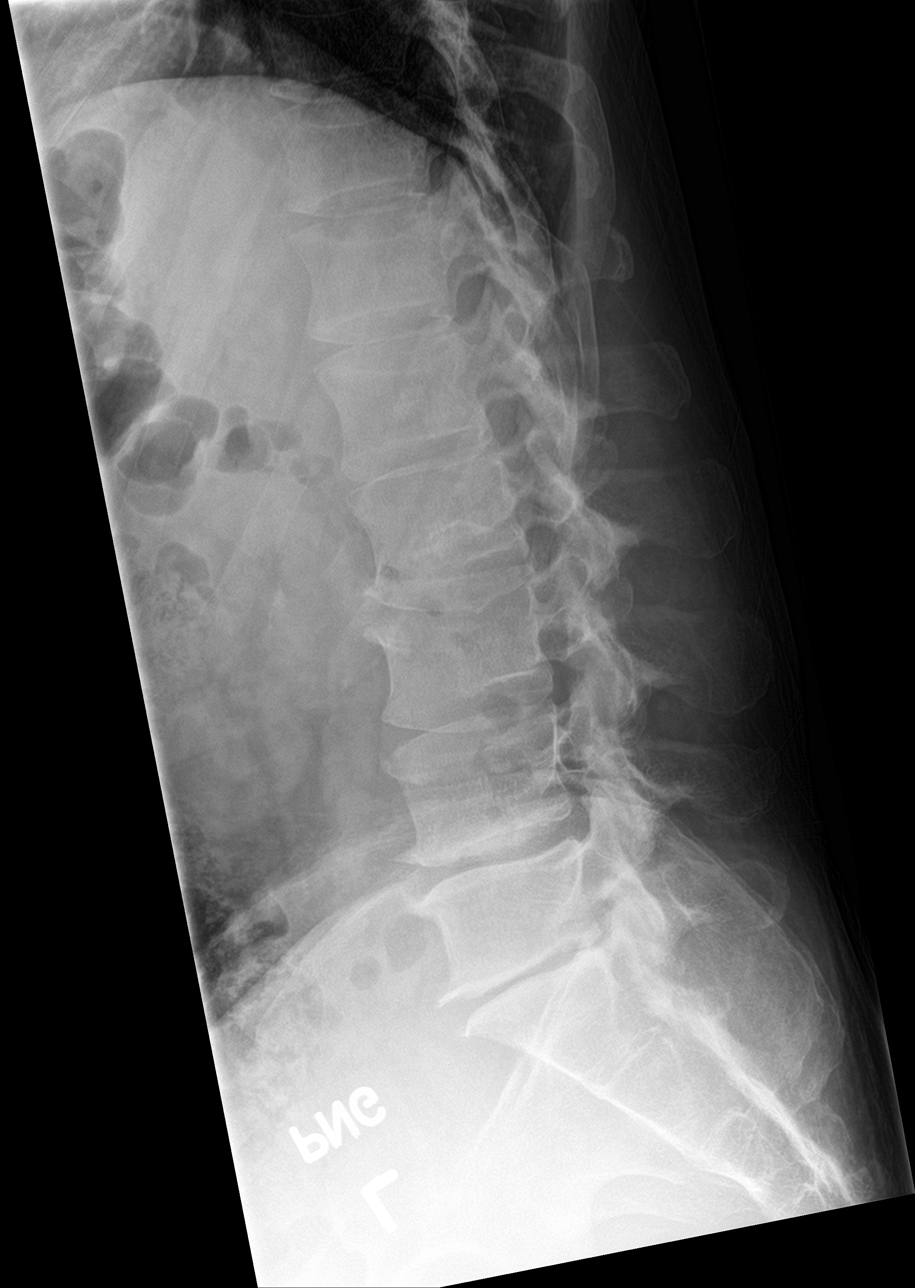

[l-spine spot]
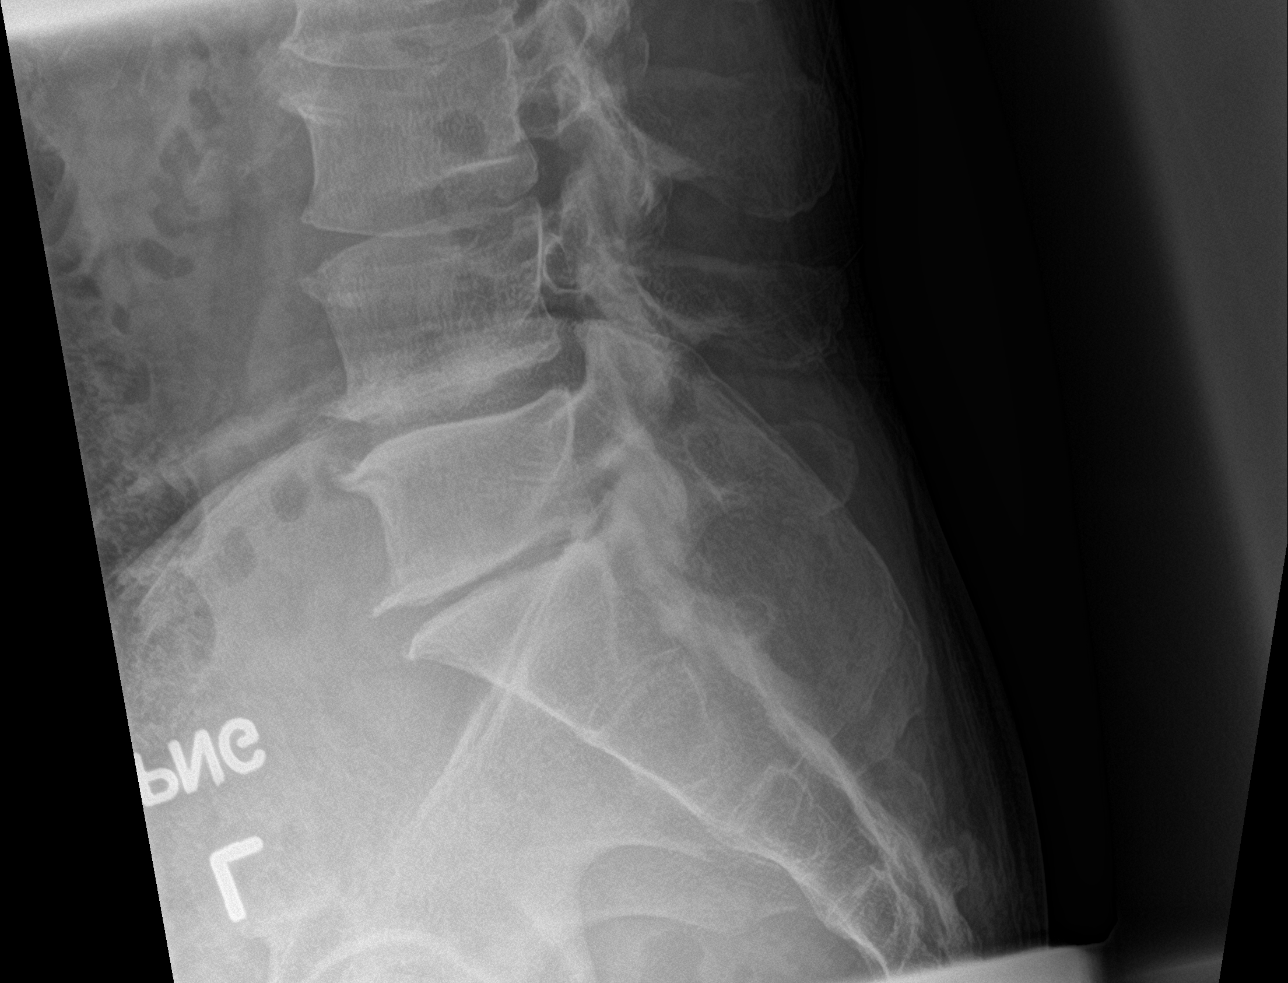

[5 of 5 positions shown; findings below may reference images not displayed]

FINDINGS: Five lumbar type vertebral bodies. No acute fracture or subluxation.
Vertebral body heights are preserved. Straightening of the normal
lumbar lordosis. Slight left lateral translation of L2 on L3.
Mild-to-moderate disc height loss asymmetric to the left at L4-L5.
Moderate disc height loss at L5-S1. Moderate facet arthropathy at
L4-L5 and L5-S1. Sacroiliac joints are unremarkable.
IMPRESSION: 1. Lower lumbar spondylosis as described above. No acute osseous
abnormality.

## 2022-04-20 ENCOUNTER — Other Ambulatory Visit: Payer: Self-pay | Admitting: Orthopedic Surgery

## 2022-04-20 DIAGNOSIS — M19012 Primary osteoarthritis, left shoulder: Secondary | ICD-10-CM

## 2022-04-28 ENCOUNTER — Other Ambulatory Visit: Payer: BC Managed Care – PPO

## 2022-05-07 ENCOUNTER — Ambulatory Visit
Admission: RE | Admit: 2022-05-07 | Discharge: 2022-05-07 | Disposition: A | Discharge status: Home / Self Care | Payer: BC Managed Care – PPO | Source: Ambulatory Visit | Attending: Orthopedic Surgery | Admitting: Orthopedic Surgery

## 2022-05-07 DIAGNOSIS — M19012 Primary osteoarthritis, left shoulder: Secondary | ICD-10-CM

## 2022-06-04 ENCOUNTER — Other Ambulatory Visit: Payer: Self-pay | Admitting: Orthopedic Surgery

## 2022-06-04 ENCOUNTER — Ambulatory Visit
Admission: RE | Admit: 2022-06-04 | Discharge: 2022-06-04 | Disposition: A | Discharge status: Home / Self Care | Payer: BC Managed Care – PPO | Source: Ambulatory Visit | Attending: Orthopedic Surgery | Admitting: Orthopedic Surgery

## 2022-06-04 DIAGNOSIS — M25511 Pain in right shoulder: Secondary | ICD-10-CM
